# Patient Record
Sex: Female | Born: 1955 | Race: White | Hispanic: No | Marital: Married | Smoking: Never smoker
Health system: Southern US, Community
[De-identification: ages and names within clinical notes are randomized; demographics above are authoritative.]

## PROBLEM LIST (undated history)

## (undated) DIAGNOSIS — I10 Essential (primary) hypertension: Secondary | ICD-10-CM

## (undated) HISTORY — PX: APPENDECTOMY: SHX54

---

## 2017-07-24 ENCOUNTER — Encounter (HOSPITAL_COMMUNITY): Payer: Self-pay | Admitting: Emergency Medicine

## 2017-07-24 ENCOUNTER — Emergency Department (HOSPITAL_COMMUNITY): Payer: Medicaid - Out of State

## 2017-07-24 ENCOUNTER — Emergency Department (HOSPITAL_COMMUNITY)
Admission: EM | Admit: 2017-07-24 | Discharge: 2017-07-24 | Disposition: A | Discharge status: Home / Self Care | Payer: Medicaid - Out of State | Attending: Emergency Medicine | Admitting: Emergency Medicine

## 2017-07-24 DIAGNOSIS — Z5321 Procedure and treatment not carried out due to patient leaving prior to being seen by health care provider: Secondary | ICD-10-CM | POA: Diagnosis not present

## 2017-07-24 DIAGNOSIS — R300 Dysuria: Secondary | ICD-10-CM | POA: Diagnosis not present

## 2017-07-24 DIAGNOSIS — R079 Chest pain, unspecified: Secondary | ICD-10-CM | POA: Insufficient documentation

## 2017-07-24 DIAGNOSIS — R413 Other amnesia: Secondary | ICD-10-CM | POA: Insufficient documentation

## 2017-07-24 HISTORY — DX: Essential (primary) hypertension: I10

## 2017-07-24 LAB — BASIC METABOLIC PANEL
ANION GAP: 13 (ref 5–15)
BUN: 11 mg/dL (ref 6–20)
CHLORIDE: 99 mmol/L — AB (ref 101–111)
CO2: 24 mmol/L (ref 22–32)
Calcium: 9.7 mg/dL (ref 8.9–10.3)
Creatinine, Ser: 0.84 mg/dL (ref 0.44–1.00)
GFR calc Af Amer: >60 mL/min (ref >60)
Glucose, Bld: 98 mg/dL (ref 65–99)
POTASSIUM: 4.1 mmol/L (ref 3.5–5.1)
Sodium: 136 mmol/L (ref 135–145)

## 2017-07-24 LAB — I-STAT TROPONIN, ED: Troponin i, poc: 0 ng/mL (ref 0.00–0.08)

## 2017-07-24 LAB — CBC
HEMATOCRIT: 41.1 % (ref 36.0–46.0)
HEMOGLOBIN: 13.5 g/dL (ref 12.0–15.0)
MCH: 29.1 pg (ref 26.0–34.0)
MCHC: 32.8 g/dL (ref 30.0–36.0)
MCV: 88.6 fL (ref 78.0–100.0)
Platelets: 279 10*3/uL (ref 150–400)
RBC: 4.64 MIL/uL (ref 3.87–5.11)
RDW: 13.6 % (ref 11.5–15.5)
WBC: 5.6 10*3/uL (ref 4.0–10.5)

## 2017-07-24 NOTE — ED Notes (Signed)
Husband states they are leaving due to wait.  Pt states she doesn't feel any better. Encouraged her to stay.  Husband states he is leaving and asked pt what she was doing.  Pt states she is going home.

## 2017-07-24 NOTE — ED Triage Notes (Signed)
Pt reports that she has been having chest pain, and a hx of low sodium 4 months ago. Pt also reports burning during urination, and having trouble with her memory for over 4 months.

## 2018-08-01 IMAGING — DX DG CHEST 2V
2 series · 2 of 2 positions shown · non-contrast
Comparison: None

CLINICAL DATA: Chest pain, hyponatremia, history hypertension

EXAM:
CHEST  2 VIEW

[chest pa]
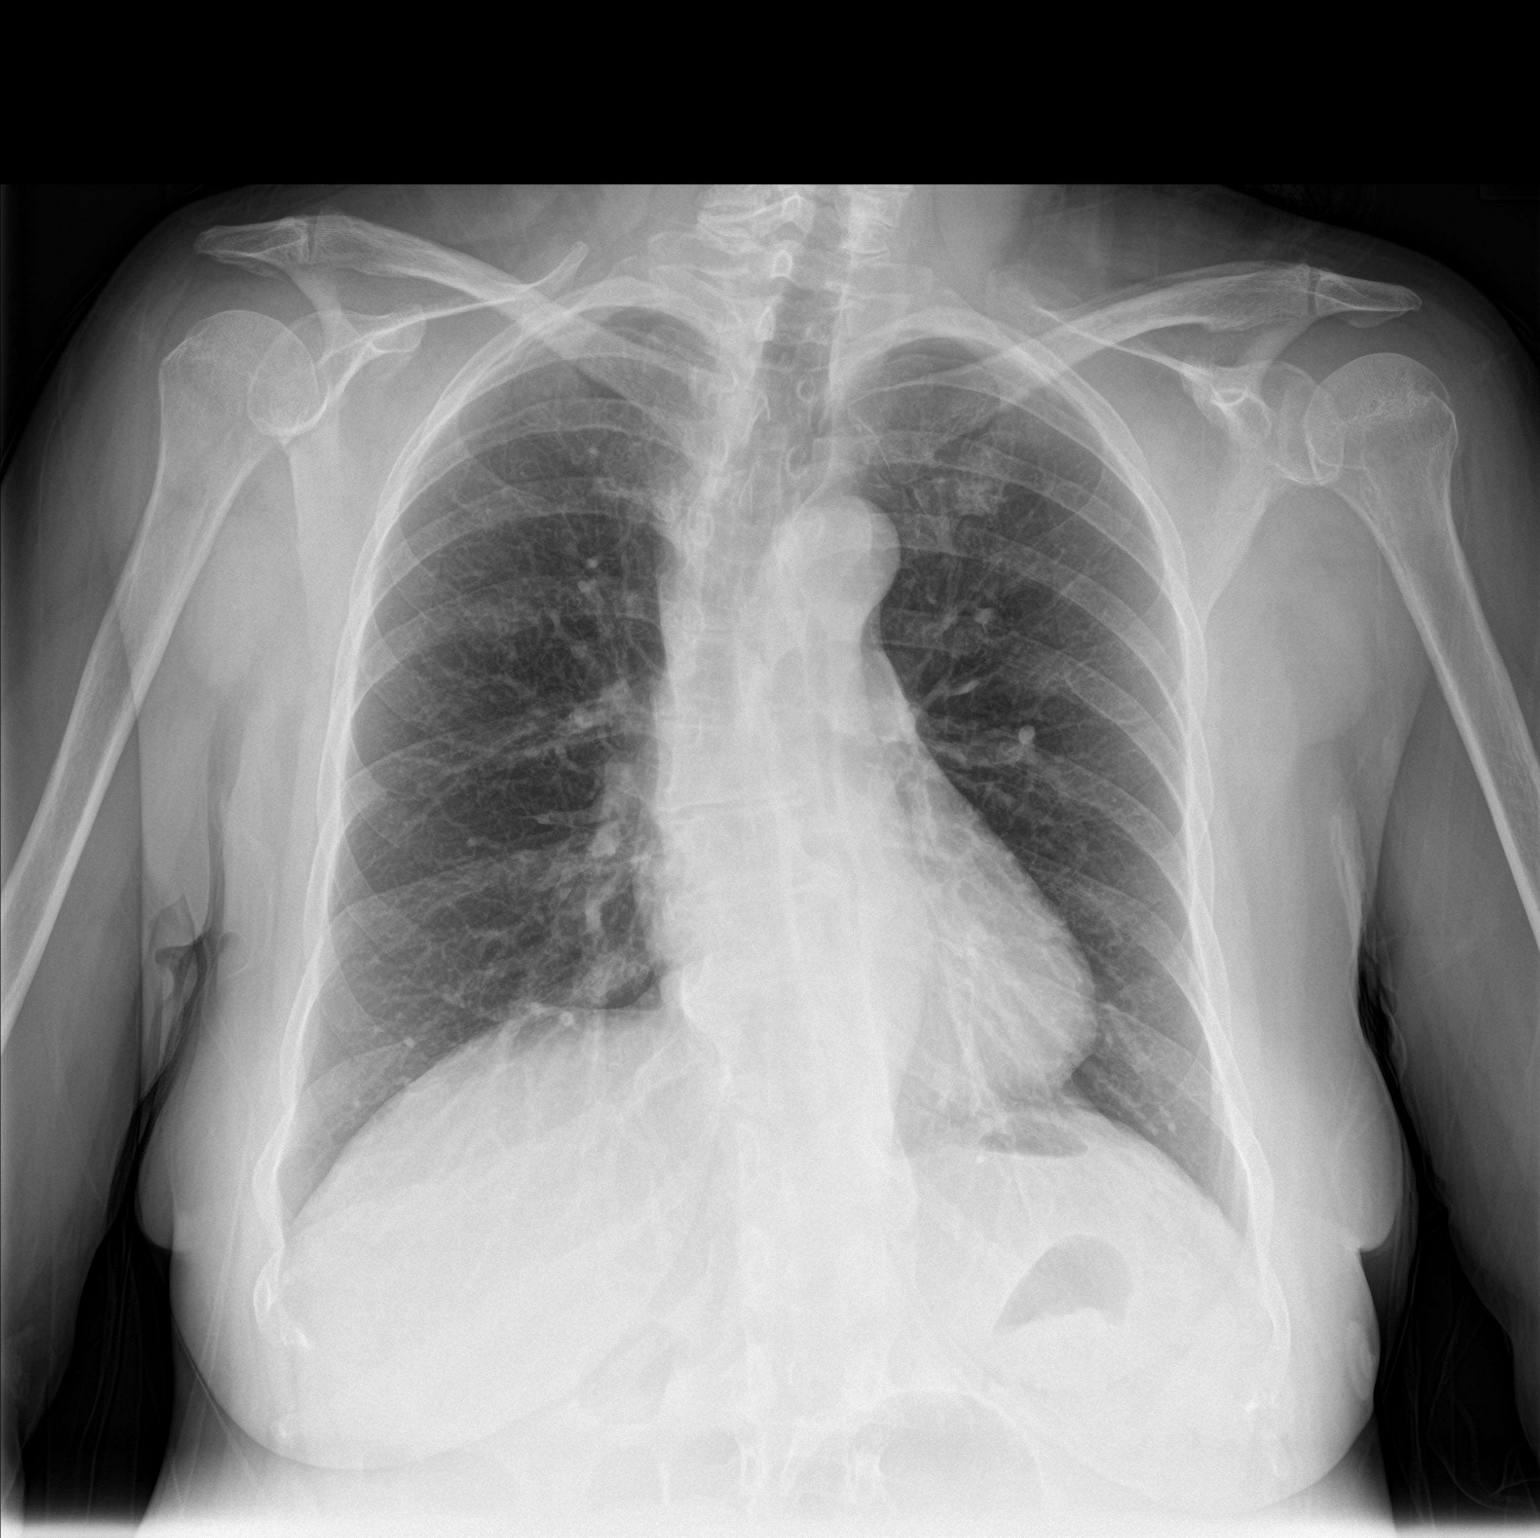

[chest lat]
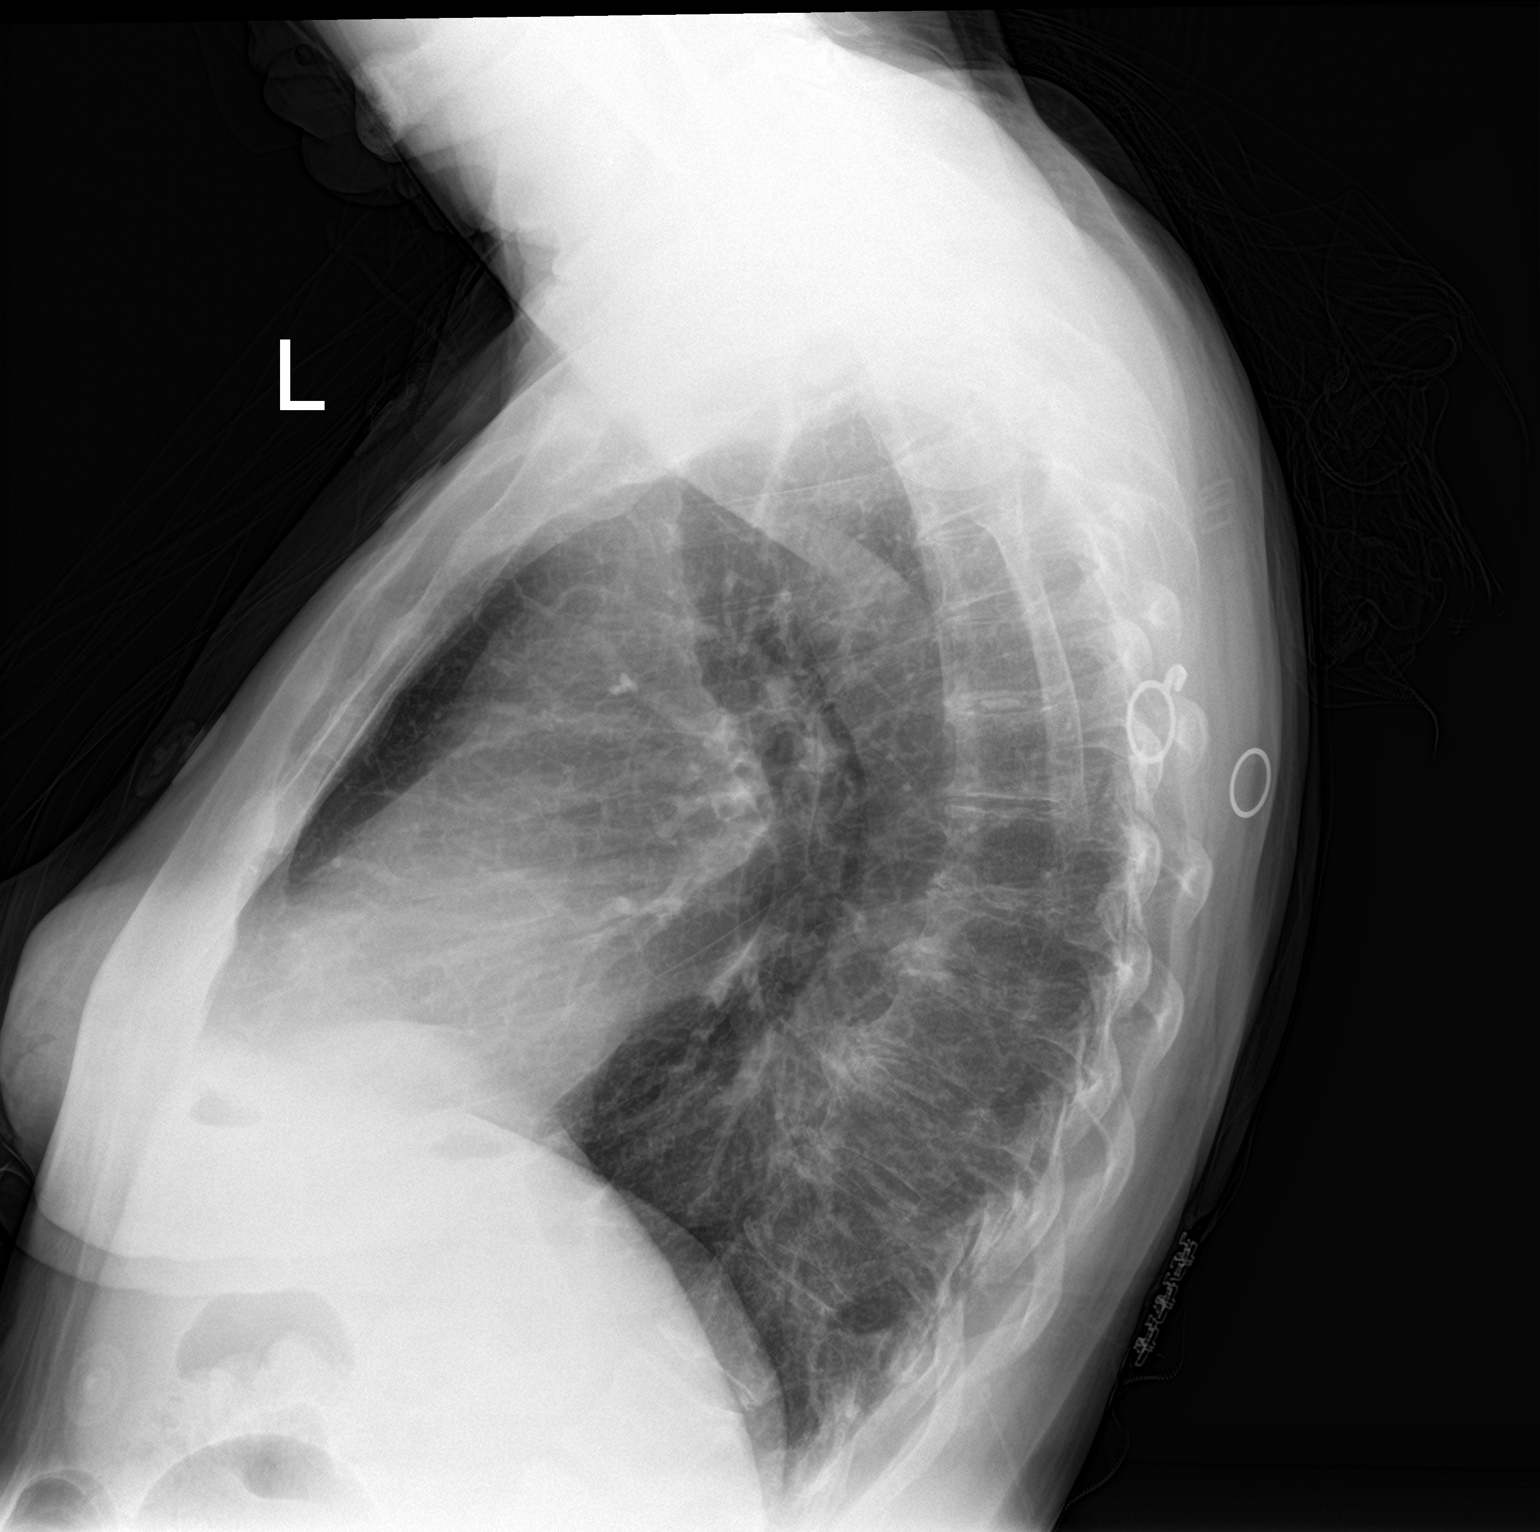

[2 of 2 positions shown; findings below may reference images not displayed]

FINDINGS: Normal heart size, mediastinal contours, and pulmonary vascularity.

Lungs clear.

No pleural effusion or pneumothorax.

Dextroconvex thoracic scoliosis.
IMPRESSION: No acute abnormalities.
